# Patient Record
Sex: Male | Born: 2005 | Race: Black or African American | Hispanic: No | Marital: Single | Smoking: Never smoker
Health system: Southern US, Community
[De-identification: ages and names within clinical notes are randomized; demographics above are authoritative.]

## PROBLEM LIST (undated history)

## (undated) HISTORY — PX: WISDOM TOOTH EXTRACTION: SHX21

---
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
## 2024-01-19 ENCOUNTER — Other Ambulatory Visit: Payer: Self-pay

## 2024-01-19 ENCOUNTER — Emergency Department (HOSPITAL_BASED_OUTPATIENT_CLINIC_OR_DEPARTMENT_OTHER)
Admission: EM | Admit: 2024-01-19 | Discharge: 2024-01-19 | Disposition: A | Discharge status: Home / Self Care | Payer: Medicaid Other | Attending: Emergency Medicine | Admitting: Emergency Medicine

## 2024-01-19 ENCOUNTER — Encounter (HOSPITAL_BASED_OUTPATIENT_CLINIC_OR_DEPARTMENT_OTHER): Payer: Self-pay | Admitting: Emergency Medicine

## 2024-01-19 DIAGNOSIS — N4889 Other specified disorders of penis: Secondary | ICD-10-CM | POA: Diagnosis present

## 2024-01-19 LAB — URINALYSIS, ROUTINE W REFLEX MICROSCOPIC
Bilirubin Urine: NEGATIVE
Glucose, UA: NEGATIVE mg/dL
Ketones, ur: NEGATIVE mg/dL
Nitrite: NEGATIVE
Protein, ur: NEGATIVE mg/dL
Specific Gravity, Urine: 1.025 (ref 1.005–1.030)
pH: 6 (ref 5.0–8.0)

## 2024-01-19 LAB — URINALYSIS, MICROSCOPIC (REFLEX)

## 2024-01-19 NOTE — ED Triage Notes (Signed)
Penile pain onset today. Denies concern for stds, Denies urinary sx. Denies testicular pain.

## 2024-01-19 NOTE — Discharge Instructions (Signed)
Please stop masturbating, while your penis is healing.  I believe that you irritated the frenulum, along your penis, and this is caused some bleeding.  Please stop masturbating, follow-up with the PCP.  Watch for redness, swelling, worsening pain.

## 2024-01-19 NOTE — ED Notes (Signed)
 Pt alert and oriented X 4 at the time of discharge. RR even and unlabored. No acute distress noted. Pt verbalized understanding of discharge instructions as discussed. Pt ambulatory to lobby at time of discharge.

## 2024-01-19 NOTE — ED Provider Notes (Signed)
[place] EMERGENCY DEPARTMENT AT [facility] Provider Note   CSN: [id number] Arrival date & time: 01/19/24  1601     History  Chief Complaint  Patient presents with   Penis Pain    Travis Nicholson is a 18 y.o. male, no pertinent past medical history, who presents to the ED secondary to penis pain, that started today.  He states he was masturbating, without lubricant, and then caused severe pain, to the back of his penis.  He reports a little bit of blood, but no redness, swelling, or discharge.  Denies any kind of sexual assault, or concern for STDs.  No testicular pain, or abdominal pain  Home Medications Prior to Admission medications   Medication Sig Start Date End Date Taking? Authorizing Provider  doxycycline (VIBRA-TABS) 100 MG tablet Take 1 tablet (100 mg total) by mouth 2 (two) times daily. 10/29/22   Prosperi, Christian H, PA-C  ondansetron (ZOFRAN-ODT) 4 MG disintegrating tablet Take 1 tablet (4 mg total) by mouth every 8 (eight) hours as needed for nausea or vomiting. 12/04/21   Alvira Monday, MD  pantoprazole (PROTONIX) 20 MG tablet Take 1 tablet (20 mg total) by mouth daily for 7 days. 12/04/21 12/11/21  Alvira Monday, MD      Allergies    Patient has no known allergies.    Review of Systems   Review of Systems  Constitutional:  Negative for fever.  Genitourinary:  Positive for penile pain.    Physical Exam Updated Vital Signs BP 110/76   Pulse 85   Temp 98.5 F (36.9 C) (Oral)   Resp 18   Ht 5\' 8"  (1.727 m)   Wt 68 kg   SpO2 97%   BMI 22.81 kg/m  Physical Exam Vitals and nursing note reviewed. Exam conducted with a chaperone present.  Constitutional:      General: He is not in acute distress.    Appearance: He is well-developed.  HENT:     Head: Normocephalic and atraumatic.  Eyes:     General:        Right eye: No discharge.        Left eye: No discharge.     Conjunctiva/sclera: Conjunctivae normal.  Pulmonary:     Effort:  No respiratory distress.  Genitourinary:    Penis: Uncircumcised. No phimosis, paraphimosis, hypospadias, erythema, discharge, swelling or lesions.      Testes: Normal. Cremasteric reflex is present.        Right: Mass, tenderness or swelling not present. Right testis is descended. Cremasteric reflex is present.         Left: Mass, tenderness or swelling not present. Left testis is descended. Cremasteric reflex is present.      Comments: +irritation along frenulum of penis with slight bleeding Neurological:     Mental Status: He is alert.     Comments: Clear speech.   Psychiatric:        Behavior: Behavior normal.        Thought Content: Thought content normal.     ED Results / Procedures / Treatments   Labs (all labs ordered are listed, but only abnormal results are displayed) Labs Reviewed  URINALYSIS, ROUTINE W REFLEX MICROSCOPIC - Abnormal; Notable for the following components:      Result Value   APPearance HAZY (*)    Hgb urine dipstick MODERATE (*)    Leukocytes,Ua Kenyatta Keidel (*)    All other components within normal limits  URINALYSIS, MICROSCOPIC (REFLEX) - Abnormal; Notable for  the following components:   Bacteria, UA RARE (*)    All other components within normal limits    EKG None  Radiology No results found.  Procedures Procedures    Medications Ordered in ED Medications - No data to display  ED Course/ Medical Decision Making/ A&P                                 Medical Decision Making Patient has irritation along the frenulum of the penis, I believe this is likely secondary to aggressive masturbation.  I informed him he should stop masturbating, and give himself rest.  He denies any concern for STDs.  Or urinary symptoms.  No back or abdominal pain.  No evidence of any kind of inguinal hernias noted.  He just has irritation/bleeding from the penis frenulum.  I recommend that he follow-up with PCP, for further evaluation.  And return if he has any redness,  swelling, warmth of the area  Amount and/or Complexity of Data Reviewed Labs: ordered.    Final Clinical Impression(s) / ED Diagnoses Final diagnoses:  Penile pain    Rx / DC Orders ED Discharge Orders     None         Chrisopher Pustejovsky, [place], PA 01/19/24 1745    Vanetta Mulders, MD 01/20/24 2350

## 2024-07-26 ENCOUNTER — Emergency Department (HOSPITAL_BASED_OUTPATIENT_CLINIC_OR_DEPARTMENT_OTHER)
Admission: EM | Admit: 2024-07-26 | Discharge: 2024-07-26 | Disposition: A | Discharge status: Home / Self Care | Attending: Emergency Medicine | Admitting: Emergency Medicine

## 2024-07-26 ENCOUNTER — Encounter (HOSPITAL_BASED_OUTPATIENT_CLINIC_OR_DEPARTMENT_OTHER): Payer: Self-pay | Admitting: Emergency Medicine

## 2024-07-26 ENCOUNTER — Other Ambulatory Visit: Payer: Self-pay

## 2024-07-26 DIAGNOSIS — Z202 Contact with and (suspected) exposure to infections with a predominantly sexual mode of transmission: Secondary | ICD-10-CM | POA: Diagnosis present

## 2024-07-26 LAB — HIV ANTIBODY (ROUTINE TESTING W REFLEX): HIV Screen 4th Generation wRfx: NONREACTIVE

## 2024-07-26 MED ORDER — DOXYCYCLINE HYCLATE 100 MG PO CAPS: 100.0000 mg | ORAL_CAPSULE | Freq: Two times a day (BID) | ORAL | 0 refills | Status: AC

## 2024-07-26 MED ORDER — CEFTRIAXONE SODIUM 500 MG IJ SOLR
500.0000 mg | Freq: Once | INTRAMUSCULAR | Status: AC
  Administered 2024-07-26: 500 mg via INTRAMUSCULAR
  Filled 2024-07-26: qty 500

## 2024-07-26 MED ORDER — LIDOCAINE HCL (PF) 1 % IJ SOLN
INTRAMUSCULAR | Status: AC
  Administered 2024-07-26: 1 mL
  Filled 2024-07-26: qty 5

## 2024-07-26 NOTE — ED Provider Notes (Signed)
 [place] [facility] Provider Note   CSN: [id number] Arrival date & time: 07/26/24  0840     Patient presents with: Exposure to STD   Travis Nicholson is a 18 y.o. male.   Patient is a 18 year old healthy male with no significant past medical history who is presenting today requesting STI testing.  He reports his partner called him and told him he needed to come and be checked but did not tell him what she was diagnosed with.  He reports that he feels fine and is having no symptoms.  He has been with this partner for 4 months and has had no other partners.  He denies any dysuria, frequency, penile discharge or lesions.  The history is provided by the patient.  Exposure to STD       Prior to Admission medications   Medication Sig Start Date End Date Taking? Authorizing Provider  doxycycline  (VIBRAMYCIN ) 100 MG capsule Take 1 capsule (100 mg total) by mouth 2 (two) times daily. 07/26/24  Yes Doretha Folks, MD  ondansetron  (ZOFRAN -ODT) 4 MG disintegrating tablet Take 1 tablet (4 mg total) by mouth every 8 (eight) hours as needed for nausea or vomiting. 12/04/21   Dreama Longs, MD  pantoprazole  (PROTONIX ) 20 MG tablet Take 1 tablet (20 mg total) by mouth daily for 7 days. 12/04/21 12/11/21  Dreama Longs, MD    Allergies: Patient has no known allergies.    Review of Systems  Updated Vital Signs BP 125/81 (BP Location: Right Arm)   Pulse 81   Temp 98.7 F (37.1 C) (Oral)   Resp 12   Ht 5' 8 (1.727 m)   Wt 68 kg   SpO2 99%   BMI 22.81 kg/m   Physical Exam Vitals and nursing note reviewed.  Constitutional:      General: He is not in acute distress.    Appearance: He is normal weight.  Cardiovascular:     Rate and Rhythm: Normal rate.  Pulmonary:     Effort: Pulmonary effort is normal.  Abdominal:     General: Abdomen is flat.     Palpations: Abdomen is soft.     Tenderness: There is no abdominal tenderness.   Neurological:     Mental Status: He is alert. Mental status is at baseline.     (all labs ordered are listed, but only abnormal results are displayed) Labs Reviewed  URINALYSIS, W/ REFLEX TO CULTURE (INFECTION SUSPECTED)  HIV ANTIBODY (ROUTINE TESTING W REFLEX)  RPR  GC/CHLAMYDIA PROBE AMP () NOT AT [facility]    EKG: None  Radiology: No results found.   Procedures   Medications Ordered in the ED  cefTRIAXone  (ROCEPHIN ) injection 500 mg (has no administration in time range)                                    Medical Decision Making Amount and/or Complexity of Data Reviewed Labs: ordered.  Risk Prescription drug management.   Patient presenting today requesting STI testing.  He is currently symptom-free but a partner called him and told him that he needed to be tested but did not tell him what they tested positive for.  GC chlamydia, HIV, RPR were all tested.  Patient given IM ceftriaxone .  He was given a prescription for doxycycline  but told to hold until his results come back and if he is chlamydia positive then he can  start taking that.     Final diagnoses:  STD exposure    ED Discharge Orders          Ordered    doxycycline  (VIBRAMYCIN ) 100 MG capsule  2 times daily        07/26/24 9091               Doretha Folks, MD 07/30/24 0740

## 2024-07-26 NOTE — ED Triage Notes (Signed)
 STATES WAS TOLD BY  partner to come get checked  for STD denies any s/s  as d/c , pain   or swelling

## 2024-07-26 NOTE — Discharge Instructions (Signed)
 Avoid any sexual contact until your testing comes back and if you are positive for anything you need to avoid sex for 2 weeks.  However if you are negative you need to wait for at least 1 to 2 weeks until your partner has been completely treated.  Wear protection to avoid catching a sexually transmitted infection.

## 2024-07-27 LAB — RPR: RPR Ser Ql: NONREACTIVE

## 2024-07-28 LAB — GC/CHLAMYDIA PROBE AMP (~~LOC~~) NOT AT ARMC
Chlamydia: POSITIVE — AB
Comment: NEGATIVE
Comment: NORMAL
Neisseria Gonorrhea: NEGATIVE

## 2024-07-30 ENCOUNTER — Ambulatory Visit (HOSPITAL_COMMUNITY): Payer: Self-pay

## 2024-08-02 ENCOUNTER — Other Ambulatory Visit: Payer: Self-pay

## 2024-08-02 ENCOUNTER — Emergency Department (HOSPITAL_BASED_OUTPATIENT_CLINIC_OR_DEPARTMENT_OTHER)
Admission: EM | Admit: 2024-08-02 | Discharge: 2024-08-02 | Disposition: A | Discharge status: Home / Self Care | Attending: Emergency Medicine | Admitting: Emergency Medicine

## 2024-08-02 ENCOUNTER — Encounter (HOSPITAL_BASED_OUTPATIENT_CLINIC_OR_DEPARTMENT_OTHER): Payer: Self-pay

## 2024-08-02 DIAGNOSIS — A749 Chlamydial infection, unspecified: Secondary | ICD-10-CM | POA: Diagnosis not present

## 2024-08-02 DIAGNOSIS — A64 Unspecified sexually transmitted disease: Secondary | ICD-10-CM | POA: Diagnosis present

## 2024-08-02 MED ORDER — AZITHROMYCIN 250 MG PO TABS
1000.0000 mg | ORAL_TABLET | Freq: Once | ORAL | Status: AC
  Administered 2024-08-02: 1000 mg via ORAL
  Filled 2024-08-02: qty 4

## 2024-08-02 MED ORDER — ONDANSETRON 4 MG PO TBDP
4.0000 mg | ORAL_TABLET | Freq: Once | ORAL | Status: AC
  Administered 2024-08-02: 4 mg via ORAL
  Filled 2024-08-02: qty 1

## 2024-08-02 NOTE — ED Provider Notes (Signed)
 [place] [facility] Provider Note   CSN: [id number] Arrival date & time: 08/02/24  1146     Patient presents with: SEXUALLY TRANSMITTED DISEASE   Travis Nicholson is a 18 y.o. male.   Patient presents to the emergency department today for follow-up on positive chlamydia test.  Patient was seen in the emergency department after a positive contact 1 week ago.  He was given IM Rocephin  and started on doxycycline .  He tested positive for chlamydia only.  Negative for gonorrhea.  Patient had significant intolerance to doxycycline  stating that he was vomiting.  He thinks that he only successfully took about 3 days of the medication.  This morning his urine felt hot.  No discharge reported.  He is here because he was uncertain if the medication was effective.  Denies other complaints.       Prior to Admission medications   Medication Sig Start Date End Date Taking? Authorizing Provider  doxycycline  (VIBRAMYCIN ) 100 MG capsule Take 1 capsule (100 mg total) by mouth 2 (two) times daily. 07/26/24   Doretha Folks, MD  ondansetron  (ZOFRAN -ODT) 4 MG disintegrating tablet Take 1 tablet (4 mg total) by mouth every 8 (eight) hours as needed for nausea or vomiting. 12/04/21   Dreama Longs, MD  pantoprazole  (PROTONIX ) 20 MG tablet Take 1 tablet (20 mg total) by mouth daily for 7 days. 12/04/21 12/11/21  Dreama Longs, MD    Allergies: Patient has no known allergies.    Review of Systems  Updated Vital Signs BP (!) 112/57 (BP Location: Left Arm)   Pulse 91   Temp 98.2 F (36.8 C) (Oral)   Resp 16   Ht 5' 8 (1.727 m)   Wt 74.8 kg   SpO2 99%   BMI 25.09 kg/m   Physical Exam Vitals and nursing note reviewed.  Constitutional:      Appearance: He is well-developed.  HENT:     Head: Normocephalic and atraumatic.  Eyes:     Conjunctiva/sclera: Conjunctivae normal.  Pulmonary:     Effort: No respiratory distress.  Musculoskeletal:      Cervical back: Normal range of motion and neck supple.  Skin:    General: Skin is warm and dry.  Neurological:     Mental Status: He is alert.     (all labs ordered are listed, but only abnormal results are displayed) Labs Reviewed - No data to display  EKG: None  Radiology: No results found.   Procedures   Medications Ordered in the ED  ondansetron  (ZOFRAN -ODT) disintegrating tablet 4 mg (has no administration in time range)  azithromycin  (ZITHROMAX ) tablet 1,000 mg (has no administration in time range)   ED Course  Patient seen and examined. History obtained directly from patient.   Labs/EKG: Ordered GC/chlamydia testing.  Imaging: None ordered  Medications/Fluids: Ordered: ODT Zofran , p.o. azithromycin  x 1.   Most recent vital signs reviewed and are as follows: BP (!) 112/57 (BP Location: Left Arm)   Pulse 91   Temp 98.2 F (36.8 C) (Oral)   Resp 16   Ht 5' 8 (1.727 m)   Wt 74.8 kg   SpO2 99%   BMI 25.09 kg/m   Initial impression: Concern for unsuccessful treatment for urethritis due to chlamydia.  I think that it is reasonable to go ahead and give p.o. azithromycin  given his intolerance to doxycycline .  Will recheck chlamydia testing.  We discussed that doxycycline  has better efficacy, but in this case, azithromycin  hopefully will  work.  Patient counseled on safe sexual practices, encouraged them to avoid sexual contact for 7 days and to inform sexual partners so that they can get tested and treated as well. Patient verbalizes understanding and agrees with plan.                                       Medical Decision Making Risk Prescription drug management.   Patient with recently diagnosed chlamydial urethritis.  He was appropriately prescribed doxycycline , but was unable to tolerate more than a few doses.  He had vomiting, resolved after discontinuation of medication.  He was having some mild dysuria today.  Will retest, patient given oral dose of  azithromycin  as an alternative.  He otherwise looks well.  No indication for further workup.  Gonorrhea testing was negative.     Final diagnoses:  Chlamydia infection    ED Discharge Orders     None          Desiderio Chew, DEVONNA 08/02/24 1224    Randol Simmonds, MD 08/02/24 1520

## 2024-08-02 NOTE — Discharge Instructions (Signed)
 Please read and follow all provided instructions.  Your diagnoses today include:  1. Chlamydia infection     Tests performed today include: Test for gonorrhea and chlamydia.  Vital signs. See below for your results today.   Medications:  For treatment of chlamydia: You were given a one-time dose of azithromycin  today  Home care instructions:  Read educational materials contained in this packet and follow any instructions provided.   You should tell your partners about your infection and avoid having sex for one week to allow time for the medicine to work.  Sexually transmitted disease testing also available at:  [facility] [place], [facility] [address], [place], phone [phone] or [phone]   Monday - Friday, call for an appointment  Return instructions:  Please return to the Emergency Department if you experience worsening symptoms.  Please return if you have any other emergent concerns.  Additional Information:  Your vital signs today were: BP (!) 112/57 (BP Location: Left Arm)   Pulse 91   Temp 98.2 F (36.8 C) (Oral)   Resp 16   Ht 5' 8 (1.727 m)   Wt 74.8 kg   SpO2 99%   BMI 25.09 kg/m  If your blood pressure (BP) was elevated above 135/85 this visit, please have this repeated by your doctor within one month. --------------

## 2024-08-02 NOTE — ED Triage Notes (Signed)
 Pt states he was here about a week ago and was prescribed some medication for Chlamydia. States the medication made him throw up and he is now here because he is concerned that the medicine may not have done it's job. Pt is no longer vomiting. No other symptoms.

## 2024-08-04 LAB — GC/CHLAMYDIA PROBE AMP (~~LOC~~) NOT AT ARMC
Chlamydia: NEGATIVE
Comment: NEGATIVE
Comment: NORMAL
Neisseria Gonorrhea: NEGATIVE
